# Patient Record
Sex: Female | Born: 1990 | Race: White | Hispanic: No | Marital: Single | Smoking: Never smoker
Health system: Southern US, Community
[De-identification: ages and names within clinical notes are randomized; demographics above are authoritative.]

## PROBLEM LIST (undated history)

## (undated) DIAGNOSIS — F5002 Anorexia nervosa, binge eating/purging type: Secondary | ICD-10-CM

## (undated) DIAGNOSIS — F50029 Anorexia nervosa, binge eating/purging type, unspecified: Secondary | ICD-10-CM

## (undated) DIAGNOSIS — E079 Disorder of thyroid, unspecified: Secondary | ICD-10-CM

## (undated) HISTORY — DX: Disorder of thyroid, unspecified: E07.9

## (undated) HISTORY — DX: Anorexia nervosa, binge eating/purging type, unspecified: F50.029

## (undated) HISTORY — DX: Anorexia nervosa, binge eating/purging type: F50.02

---
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
## 2010-09-01 ENCOUNTER — Ambulatory Visit: Payer: Self-pay | Admitting: Family Medicine

## 2010-09-01 DIAGNOSIS — G43909 Migraine, unspecified, not intractable, without status migrainosus: Secondary | ICD-10-CM | POA: Insufficient documentation

## 2010-09-01 DIAGNOSIS — N920 Excessive and frequent menstruation with regular cycle: Secondary | ICD-10-CM

## 2010-09-01 DIAGNOSIS — E039 Hypothyroidism, unspecified: Secondary | ICD-10-CM | POA: Insufficient documentation

## 2010-09-01 DIAGNOSIS — Z9189 Other specified personal risk factors, not elsewhere classified: Secondary | ICD-10-CM

## 2010-09-07 ENCOUNTER — Encounter: Payer: Self-pay | Admitting: Family Medicine

## 2010-10-06 ENCOUNTER — Ambulatory Visit: Payer: Self-pay | Admitting: Family Medicine

## 2010-10-06 DIAGNOSIS — R1011 Right upper quadrant pain: Secondary | ICD-10-CM | POA: Insufficient documentation

## 2010-10-07 ENCOUNTER — Encounter: Payer: Self-pay | Admitting: Family Medicine

## 2010-10-07 ENCOUNTER — Emergency Department: Payer: Self-pay | Admitting: Emergency Medicine

## 2010-10-10 ENCOUNTER — Telehealth: Payer: Self-pay | Admitting: Family Medicine

## 2010-10-14 ENCOUNTER — Encounter: Payer: Self-pay | Admitting: Family Medicine

## 2010-11-09 ENCOUNTER — Encounter: Payer: Self-pay | Admitting: Family Medicine

## 2011-01-03 NOTE — Assessment & Plan Note (Signed)
Summary: NEW PT TO EST/CPX/CLE   Vital Signs:  Patient profile:   20 year old female Height:      68 inches Weight:      144 pounds BMI:     21.97 Temp:     98.6 degrees F oral Pulse rate:   76 / minute Pulse rhythm:   regular BP sitting:   94 / 70  (right arm) Cuff size:   regular  Vitals Entered By: Linde Gillis CMA [facility] Dull) (September 01, 2010 9:58 AM) CC: new patient, establish care   History of Present Illness: 20 yo here to establish care. Student at [employer], [employer] from [place], [place].  H/o anorexia and bulemia- became very severe last year, had to transfer to [employer] [facility] to be closer to home but came back to [place] once her parents felt she was healthy.  Feels that she is still consumed by her body image issues but is eating better and no longer binging or purging. Sees Dr. Omelia Blackwater.  He placed her on Adderall 20 mg XL to help her focus while studying since her mind often wanders to thoughts of eating and calories.  The Adderall has helped quite a bit.  Does not like talking about it.  Feels like talking about it with therapists makes it worse.  Has a supportive boyfriend.  Hypothyroidism- followed by endocrinology, Dr. Tedd Sias, (awaiting records). Has been on current dose of levothyroxine for over a year.  Per pt, TSH was normal this month.  Denies any symptoms of hypo or hyperthyroidism other than occassional constipation.  Menorrhagia- afraid to start OCPs given possibility of weight gain.  Was on Loestrin in past and felt it caused weight gain.  Periods are very irregular and very heavy.  She is sexually active and boyfriend often does not want to wear condoms.  Migraines- followed by headache center back home.  On prophylactic Topamax but still has 1-2 per month.  Takes Treximet as needed headache.  Leaving for [place] in January for a mission trip.  Needs Hep A vaccination.  School will provide malaria prophylaxis.  Preventive Screening-Counseling &  Management  Caffeine-Diet-Exercise     Does Patient Exercise: yes      Drug Use:  no.    Current Medications (verified): 1)  Topamax 100 Mg Tabs (Topiramate) .... Take 150mg  in The Morning and 200mg  in The Evening 2)  Adderall 20 Mg Tabs (Amphetamine-Dextroamphetamine) .... Take One Tablet By Mouth Daily 3)  Cytomel 25 Mcg Tabs (Liothyronine Sodium) .... Take 1/2 Tablet By Mouth Daily 4)  Levothroid 88 Mcg Tabs (Levothyroxine Sodium) .... Take 1/2 Tablet By Mouth Daily 5)  Yaz 3-0.02 Mg  Tabs (Drospirenone-Ethinyl Estradiol) .... Use As Directed. 6)  Azithromycin 250 Mg  Tabs (Azithromycin) .... 2 By  Mouth Today and Then 1 Daily For 4 Days  Allergies (verified): 1)  ! * Cefzil  Past History:  Family History: Last updated: 09/01/2010 Mom and dad alive and healthy.  Social History: Last updated: 09/01/2010 Has a boyfriend.   Alcohol use-no Drug use-no Regular exercise-yes Student at [employer].  Risk Factors: Exercise: yes (09/01/2010)  Past Medical History: Anorexia, bulemia - sees Dr. Omelia Blackwater Hypothyroidism Migraines  Past Surgical History: Denies surgical history  Family History: Mom and dad alive and healthy.  Social History: Has a boyfriend.   Alcohol use-no Drug use-no Regular exercise-yes Student at [employer].Drug Use:  no Does Patient Exercise:  yes  Review of Systems      See HPI General:  Denies loss of appetite and weight loss. Eyes:  Denies blurring. ENT:  Denies difficulty swallowing. CV:  Denies chest pain or discomfort. Resp:  Denies shortness of breath. GI:  Denies abdominal pain. GU:  Complains of abnormal vaginal bleeding; denies discharge and dysuria. MS:  Denies joint pain, joint redness, and joint swelling. Derm:  Denies rash. Neuro:  Complains of headaches; denies visual disturbances and weakness. Psych:  Denies sense of great danger, suicidal thoughts/plans, thoughts of violence, unusual visions or sounds, and thoughts  /plans of harming others. Endo:  Denies cold intolerance and heat intolerance. Heme:  Denies abnormal bruising and bleeding.  Physical Exam  General:  Well-developed,well-nourished,in no acute distress; alert,appropriate and cooperative throughout examination Head:  normocephalic, atraumatic, no abnormalities observed, and no abnormalities palpated.   Eyes:  vision grossly intact, pupils equal, pupils round, and pupils reactive to light.   Ears:  R ear normal and L ear normal.   Nose:  no external deformity.   Mouth:  good dentition and no gingival abnormalities.   Neck:  No deformities, masses, or tenderness noted. Lungs:  Normal respiratory effort, chest expands symmetrically. Lungs are clear to auscultation, no crackles or wheezes. Heart:  Normal rate and regular rhythm. S1 and S2 normal without gallop, murmur, click, rub or other extra sounds. Abdomen:  Bowel sounds positive,abdomen soft and non-tender without masses, organomegaly or hernias noted. Msk:  No deformity or scoliosis noted of thoracic or lumbar spine.   Extremities:  no edema Neurologic:  alert & oriented X3 and gait normal.   Skin:  Intact without suspicious lesions or rashes Psych:  Cognition and judgment appear intact. Alert and cooperative with normal attention span and concentration. No apparent delusions, illusions, hallucinations   Impression & Recommendations:  Problem # 1:  HYPOTHYROIDISM (ICD-244.9) Assessment Unchanged Per pt, stable on current meds, awaiting records. Her updated medication list for this problem includes:    Cytomel 25 Mcg Tabs (Liothyronine sodium) .Marland Kitchen... Take 1/2 tablet by mouth daily    Levothroid 88 Mcg Tabs (Levothyroxine sodium) .Marland Kitchen... Take 1/2 tablet by mouth daily  Problem # 2:  EATING DISORDER, HX OF (ICD-V15.89) Assessment: Unchanged Time spent with patient 45 minutes, more than 50% of this time was spent discussing her eating disorder.  She is still having issues and we discussed  this will likely be a lifelong struggle for her.  Although she is seeing Dr. Omelia Blackwater, she feels she does not talk with him much ( her own choice).  After talking with her, she does understand importance of staying ahead of this disease.  She will consider counselling.  Problem # 3:  MENORRHAGIA (ICD-626.2) Assessment: Deteriorated Will start Yaz.  Discussed potential interaction with Topamax.  She will call me in one month with an update. Her updated medication list for this problem includes:    Yaz 3-0.02 Mg Tabs (Drospirenone-ethinyl estradiol) ..... Use as directed.  Problem # 4:  MIGRAINE HEADACHE (ICD-346.90) Assessment: Unchanged Stable, per [facility].  Complete Medication List: 1)  Topamax 100 Mg Tabs (Topiramate) .... Take 150mg  in the morning and 200mg  in the evening 2)  Adderall 20 Mg Tabs (Amphetamine-dextroamphetamine) .... Take one tablet by mouth daily 3)  Cytomel 25 Mcg Tabs (Liothyronine sodium) .... Take 1/2 tablet by mouth daily 4)  Levothroid 88 Mcg Tabs (Levothyroxine sodium) .... Take 1/2 tablet by mouth daily 5)  Yaz 3-0.02 Mg Tabs (Drospirenone-ethinyl estradiol) .... Use as directed. 6)  Azithromycin 250 Mg Tabs (Azithromycin) .... 2 by  mouth today and then 1 daily for 4 days  Other Orders: Hepatitis A Vaccine (Adult Dose) (93716) Admin 1st Vaccine (96789) Flu Vaccine 1yrs + (38101) Admin of Any Addtl Vaccine (75102) Prescriptions: AZITHROMYCIN 250 MG  TABS (AZITHROMYCIN) 2 by  mouth today and then 1 daily for 4 days  #6 x 0   Entered and Authorized by:   Ruthe Mannan MD   Signed by:   Ruthe Mannan MD on 09/01/2010   Method used:   Print then Give to Patient   RxID:   [id number] YAZ 3-0.02 MG  TABS (DROSPIRENONE-ETHINYL ESTRADIOL) Use as directed.  #1 x 11   Entered and Authorized by:   Ruthe Mannan MD   Signed by:   Ruthe Mannan MD on 09/01/2010   Method used:   Print then Give to Patient   RxID:   [id number]   Current Allergies (reviewed today): !  * CEFZIL   Immunizations Administered:  Hepatitis A Vaccine # 1:    Vaccine Type: HepA    Site: left deltoid    Mfr: GlaxoSmithKline    Dose: 0.5 ml    Route: IM    Given by: Linde Gillis CMA (AAMA)    Exp. Date: 02/18/2012    Lot #: [id number]    VIS given: 02/21/05 version given September 01, 2010.  Influenza Vaccine # 1:    Vaccine Type: Fluvax 3+    Site: left deltoid    Mfr: GlaxoSmithKline    Dose: 0.5 ml    Route: IM    Given by: Linde Gillis CMA (AAMA)    Exp. Date: 06/03/2011    Lot #: [id number]    VIS given: 06/28/10 version given September 01, 2010.  PAP Result Date:  07/29/2010 PAP Result:  historical normal

## 2011-01-03 NOTE — Progress Notes (Signed)
Summary: still having abd pain  Phone Note Call from Patient Call back at Home Phone [phone]   Caller: Patient Call For: Angela Mannan MD Summary of Call: Pt was seen on 11/3 for stomach pain, went to [facility] ER and had ultrasound- which was negative.  She is still having the pain, off and on.  She is asking what to do next, should she be referred to specialist? Initial call taken by: Lowella Petties CMA, AAMA,  October 10, 2010 12:04 PM  Follow-up for Phone Call        We can refer to GI.  Let me know and I will place referral. Angela Mannan MD  October 10, 2010 12:54 PM  Patient advised as instructed, she agrees to have GI referral.  Linde Gillis CMA Duncan Dull)  October 10, 2010 2:57 PM

## 2011-01-03 NOTE — Assessment & Plan Note (Signed)
Summary: Stomach problems/? birth control pills   Vital Signs:  Patient profile:   20 year old female Height:      68 inches Weight:      143 pounds BMI:     21.82 Temp:     98.4 degrees F oral Pulse rate:   68 / minute Pulse rhythm:   regular BP sitting:   120 / 72  (left arm) Cuff size:   regular  Vitals Entered By: Linde Gillis CMA Duncan Dull) (October 06, 2010 9:39 AM) CC: ? birth control maybe causing stomach pain   History of Present Illness: 20 yo here for stomach issues.  Started Angela Wolfe for OCP at beginning of October.  Since starting it, has had intermittent epigastric and RUQ pain, especially at night. She is not sure if it is worsened by food. Has tried taking a Tums and a Pepcid and not sure if it alleviates it or if it just goes away on its own. Has been associated with nausea and did vomit once.  No change in bowel habits. No headaches.  Otherwise likes Yaz.  Under a great deal of stress at school for the past several weeks- has 5 research papers due at the same time and admittedly gets really stressed out. Tries to go for a run when she gets really stressed out but she knows that it is likely contributing to her stomach issues.   Current Medications (verified): 1)  Topamax 100 Mg Tabs (Topiramate) .... Take 150mg  in The Morning and 200mg  in The Evening 2)  Adderall 20 Mg Tabs (Amphetamine-Dextroamphetamine) .... Take One Tablet By Mouth Daily 3)  Cytomel 25 Mcg Tabs (Liothyronine Sodium) .... Take 1/2 Tablet By Mouth Daily 4)  Levothroid 88 Mcg Tabs (Levothyroxine Sodium) .... Take 1/2 Tablet By Mouth Daily 5)  Yaz 3-0.02 Mg  Tabs (Drospirenone-Ethinyl Estradiol) .... Use As Directed.  Allergies: 1)  ! * Cefzil  Past History:  Past Medical History: Last updated: 09/01/2010 Anorexia, bulemia - sees Dr. Omelia Blackwater Hypothyroidism Migraines  Past Surgical History: Last updated: 09/01/2010 Denies surgical history  Family History: Last updated: 09/01/2010 Mom  and dad alive and healthy.  Social History: Last updated: 09/01/2010 Has a boyfriend.   Alcohol use-no Drug use-no Regular exercise-yes Student at [employer].  Risk Factors: Exercise: yes (09/01/2010)  Review of Systems      See HPI General:  Denies fever. GI:  Complains of abdominal pain, indigestion, nausea, and vomiting; denies bloody stools, change in bowel habits, and yellowish skin color. GU:  Denies discharge and dysuria. MS:  Denies joint pain, joint redness, and joint swelling. Psych:  Complains of anxiety; denies depression and thoughts /plans of harming others.  Physical Exam  General:  Well-developed,well-nourished,in no acute distress; alert,appropriate and cooperative throughout examination Abdomen:  Bowel sounds positive,abdomen soft. Diffuse mild epigastric and RUQ pain. no masses, no guarding, no rigidity, and no rebound tenderness.   Skin:  Intact without suspicious lesions or rashes Psych:  Cognition and judgment appear intact. Alert and cooperative with normal attention span and concentration. No apparent delusions, illusions, hallucinations   Impression & Recommendations:  Problem # 1:  RUQ PAIN (ICD-789.01) Assessment New Mostly epigastric and likely due to increased acid production/GERD due to stress. OCPs can increase incidence of gallstones and other gallbladder disease so will get U/S to rule out these issues. PPI or H2 blocker daily. Continue Yaz. Orders: Radiology Referral (Radiology)  Complete Medication List: 1)  Topamax 100 Mg Tabs (Topiramate) .... Take  150mg  in the morning and 200mg  in the evening 2)  Adderall 20 Mg Tabs (Amphetamine-dextroamphetamine) .... Take one tablet by mouth daily 3)  Cytomel 25 Mcg Tabs (Liothyronine sodium) .... Take 1/2 tablet by mouth daily 4)  Levothroid 88 Mcg Tabs (Levothyroxine sodium) .... Take 1/2 tablet by mouth daily 5)  Yaz 3-0.02 Mg Tabs (Drospirenone-ethinyl estradiol) .... Use as  directed.  Patient Instructions: 1)  Please stop by to see Shirlee Limerick on your way out. 2)  Good luck with your papers. 3)  Please take an antacid like Pepcid, Zantac, or Prilosec daily.   Orders Added: 1)  Radiology Referral [Radiology] 2)  Est. Patient Level IV [69629]    Current Allergies (reviewed today): ! * CEFZIL

## 2011-01-03 NOTE — Consult Note (Signed)
Summary: [facility]   Imported By: Lester Armstrong 11/10/2010 10:46:00  _____________________________________________________________________  External Attachment:    Type:   Image     Comment:   External Document

## 2011-01-03 NOTE — Letter (Signed)
Summary: Medical Release for Study [facility]  Medical Release for Study Abroad/[employer]   Imported By: Lanelle Bal 09/13/2010 12:31:10  _____________________________________________________________________  External Attachment:    Type:   Image     Comment:   External Document

## 2011-01-03 NOTE — Letter (Signed)
Summary: Growth Chart from Office in [place]  Growth Chart from Office in [place]   Imported By: Maryln Gottron 10/31/2010 09:41:56  _____________________________________________________________________  External Attachment:    Type:   Image     Comment:   External Document

## 2011-01-03 NOTE — Miscellaneous (Signed)
Summary: Records from [place] - 2009  Records from [place] - 2009   Imported By: Maryln Gottron 10/31/2010 09:39:28  _____________________________________________________________________  External Attachment:    Type:   Image     Comment:   External Document

## 2011-01-05 NOTE — Letter (Signed)
Summary: GI/[facility]  GI/[facility]   Imported By: Lester Gunnison 11/25/2010 09:53:14  _____________________________________________________________________  External Attachment:    Type:   Image     Comment:   External Document

## 2011-07-27 DIAGNOSIS — N1330 Unspecified hydronephrosis: Secondary | ICD-10-CM | POA: Insufficient documentation

## 2011-07-27 DIAGNOSIS — N12 Tubulo-interstitial nephritis, not specified as acute or chronic: Secondary | ICD-10-CM | POA: Insufficient documentation

## 2011-07-27 NOTE — Progress Notes (Signed)
Subjective:    Patient ID: Angela Wolfe, female    DOB: Jul 09, 1991, 20 y.o.   MRN: [id number]  HPI  20 yo here for hospital follow up. Notes reviewed from [facility] in [place].  Went to ER for intractable back pain, found to have bilateral hydronephrosis, right>left and pos UA. Admitted for presumed pylonephritis due to pyruria, hydronephrosis and pos UA. She did also have low grade temp and flank pain prior to going to ER.  Transvaginal U/S unremarkable- no pelvic free fluid.  Urine culture- >100, 000 E.coli, resistent to Amp, Bactrim, Cipro and Levofloxacin.  No discharge summary- appears she was given IV Meropenem in hospital. Remained in hospital for 5 days.   Pt thinks she was sent home with macrobid x 2 weeks.  Flank and dysuria resolved.    Patient Active Problem List  Diagnoses  . HYPOTHYROIDISM  . MIGRAINE HEADACHE  . MENORRHAGIA  . RUQ PAIN  . EATING DISORDER, HX OF  . Hydronephrosis, bilateral  . Pyelonephritis   Past Medical History  Diagnosis Date  . Anorexia nervosa with bulimia     Sees Dr. Omelia Blackwater  . Thyroid disease   . Migraine    No past surgical history on file. History  Substance Use Topics  . Smoking status: Never Smoker   . Smokeless tobacco: Not on file  . Alcohol Use: No   No family history on file. Allergies  Allergen Reactions  . Cefprozil     REACTION: hives   Current Outpatient Prescriptions on File Prior to Visit  Medication Sig Dispense Refill  . amphetamine-dextroamphetamine (ADDERALL) 20 MG tablet Take 20 mg by mouth daily.        Marland Kitchen levothyroxine (SYNTHROID, LEVOTHROID) 88 MCG tablet Take 1/2 tablet by mouth daily       . liothyronine (CYTOMEL) 25 MCG tablet Take 1/2 tablet by mouth daily       . topiramate (TOPAMAX) 100 MG tablet Take 150mg  in the morning and 200mg  in the evening       . drospirenone-ethinyl estradiol (YAZ,GIANVI,LORYNA) 3-0.02 MG tablet Take 1 tablet by mouth daily.            Review of  Systems    See HPI Objective:   Physical Exam  BP 110/70  Pulse 62  Temp(Src) 99 F (37.2 C) (Oral)  Wt 139 lb 8 oz (63.277 kg)  LMP 07/24/2011  General:  Well-developed,well-nourished,in no acute distress; alert,appropriate and cooperative throughout examination Head:  normocephalic and atraumatic.   Eyes:  vision grossly intact, pupils equal, pupils round, and pupils reactive to light.   Ears:  R ear normal and L ear normal.   Nose:  no external deformity.   Mouth:  good dentition.   Neck:  No deformities, masses, or tenderness noted. Lungs:  Normal respiratory effort, chest expands symmetrically. Lungs are clear to auscultation, no crackles or wheezes. Heart:  Normal rate and regular rhythm. S1 and S2 normal without gallop, murmur, click, rub or other extra sounds. Abdomen:  Bowel sounds positive,abdomen soft and non-tender without masses, organomegaly or hernias noted. Msk:  No deformity or scoliosis noted of thoracic or lumbar spine.   Extremities:  No clubbing, cyanosis, edema, or deformity noted with normal full range of motion of all joints.   Neurologic:  alert & oriented X3 and gait normal.   Skin:  Intact without suspicious lesions or rashes Psych:  Cognition and judgment appear intact. Alert and cooperative with normal attention span and concentration.  No apparent delusions, illusions, hallucinations     Assessment & Plan:   1. Hydronephrosis, bilateral   Repeat renal ultrasound. US Renal  2. Pyelonephritis  POCT urinalysis dipstick, POCT urine pregnancy       Resolved- UA negative.  Complete course of Macrobid.

## 2011-08-02 LAB — HM PAP SMEAR

## 2011-08-03 ENCOUNTER — Ambulatory Visit (INDEPENDENT_AMBULATORY_CARE_PROVIDER_SITE_OTHER): Payer: BC Managed Care – PPO | Admitting: Family Medicine

## 2011-08-03 ENCOUNTER — Encounter: Payer: Self-pay | Admitting: Family Medicine

## 2011-08-03 VITALS — BP 110/70 | HR 62 | Temp 99.0°F | Wt 139.5 lb

## 2011-08-03 DIAGNOSIS — N12 Tubulo-interstitial nephritis, not specified as acute or chronic: Secondary | ICD-10-CM

## 2011-08-03 DIAGNOSIS — N1330 Unspecified hydronephrosis: Secondary | ICD-10-CM

## 2011-08-03 LAB — POCT URINALYSIS DIPSTICK
Bilirubin, UA: NEGATIVE
Ketones, UA: NEGATIVE
Leukocytes, UA: NEGATIVE
Nitrite, UA: NEGATIVE
Protein, UA: NEGATIVE

## 2011-08-03 LAB — POCT URINE PREGNANCY: Preg Test, Ur: NEGATIVE

## 2011-08-03 NOTE — Progress Notes (Signed)
Addended by: Gilmer Mor on: 08/03/2011 12:45 PM   Modules accepted: Orders

## 2011-08-04 ENCOUNTER — Ambulatory Visit: Payer: Self-pay | Admitting: Family Medicine

## 2011-08-05 LAB — URINE CULTURE

## 2011-08-08 ENCOUNTER — Encounter: Payer: Self-pay | Admitting: *Deleted

## 2011-08-08 ENCOUNTER — Encounter: Payer: Self-pay | Admitting: Family Medicine

## 2012-03-24 ENCOUNTER — Emergency Department: Payer: Self-pay | Admitting: Emergency Medicine

## 2012-03-24 LAB — URINALYSIS, COMPLETE
Bacteria: NONE SEEN
Bilirubin,UR: NEGATIVE
Ketone: NEGATIVE
Leukocyte Esterase: NEGATIVE
Nitrite: NEGATIVE
Ph: 6 (ref 4.5–8.0)
RBC,UR: <1 /HPF (ref 0–5)
Specific Gravity: 1.01 (ref 1.003–1.030)
WBC UR: <1 /HPF (ref 0–5)

## 2012-03-24 LAB — COMPREHENSIVE METABOLIC PANEL
BUN: 19 mg/dL — ABNORMAL HIGH (ref 7–18)
Bilirubin,Total: 0.4 mg/dL (ref 0.2–1.0)
Chloride: 106 mmol/L (ref 98–107)
Co2: 25 mmol/L (ref 21–32)
Creatinine: 0.73 mg/dL (ref 0.60–1.30)
EGFR (Non-African Amer.): >60
SGOT(AST): 33 U/L (ref 15–37)
SGPT (ALT): 48 U/L
Total Protein: 7.4 g/dL (ref 6.4–8.2)

## 2012-03-24 LAB — CBC
HGB: 14.6 g/dL (ref 12.0–16.0)
MCH: 33.2 pg (ref 26.0–34.0)
MCHC: 33.8 g/dL (ref 32.0–36.0)
MCV: 98 fL (ref 80–100)
Platelet: 165 10*3/uL (ref 150–440)
RDW: 12.2 % (ref 11.5–14.5)
WBC: 5.8 10*3/uL (ref 3.6–11.0)

## 2012-03-24 LAB — PREGNANCY, URINE: Pregnancy Test, Urine: NEGATIVE m[IU]/mL

## 2012-03-24 LAB — LIPASE, BLOOD: Lipase: 215 U/L (ref 73–393)

## 2013-07-30 IMAGING — CT CT ABD-PELV W/ CM
1 of 2 series · 15 of 32 positions shown, 19 images · IV contrast (isovue)
Comparison: none

REASON FOR EXAM: (1) abd pain eval for obstruction; (2) abd pain eval for
obstruction;    NOTE: N
COMMENTS:

PROCEDURE:     CT  - CT ABDOMEN / PELVIS  W  - March 25, 2012  [DATE]
RESULT:     Comparison:  None
TECHNIQUE: Multiple axial images of the abdomen and pelvis were performed
from the lung bases to the pubic symphysis, with p.o. contrast and with 100
mL of Isovue 300 intravenous contrast.

[Series 2: 3mm soft tissue · axial · 0.62mm/px · z∈[-748,-322]mm · 15 of 156 slices shown, 19 images]
[im 7/156  soft-tissue]
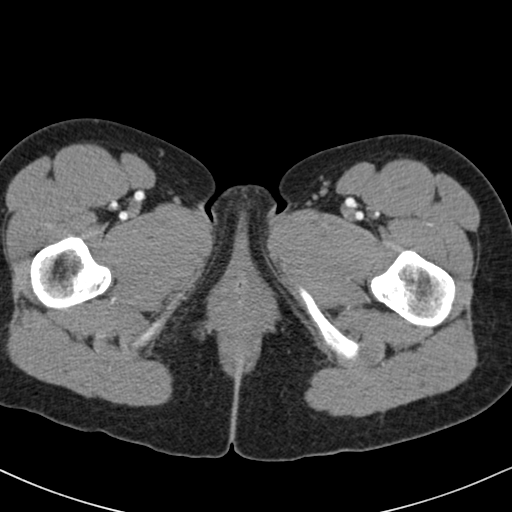
[im 7/156  bone]
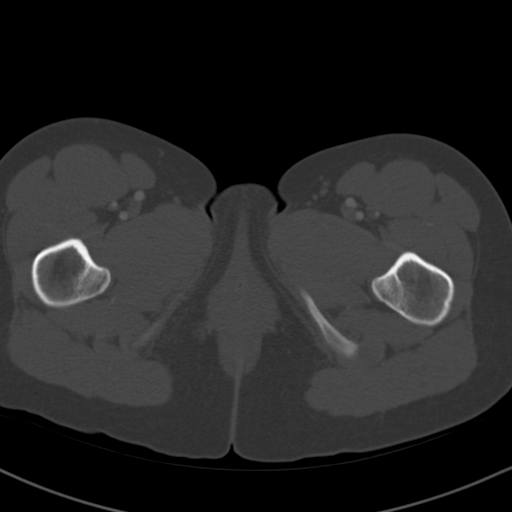
[im 19/156  soft-tissue]
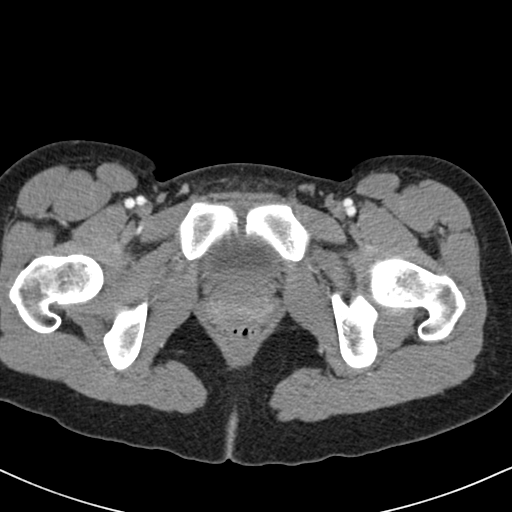
[im 32/156  soft-tissue]
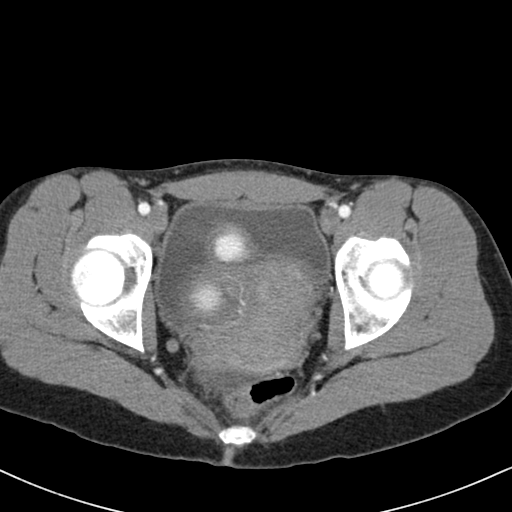
[im 44/156  soft-tissue]
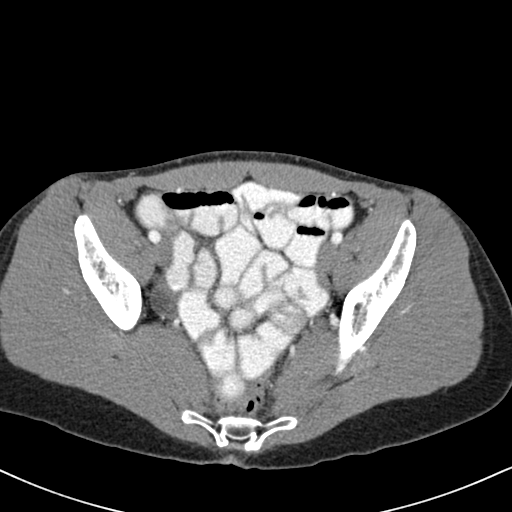
[im 56/156  soft-tissue]
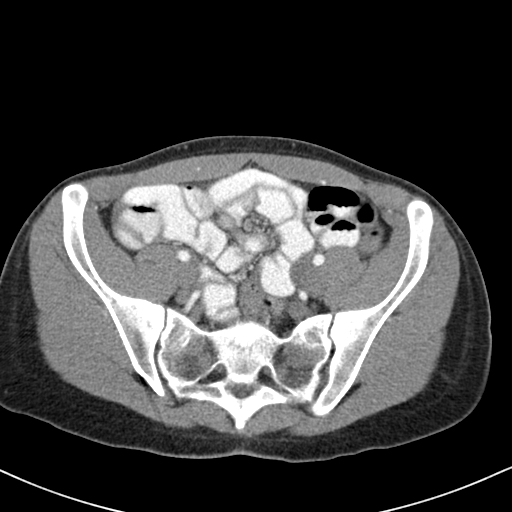
[im 69/156  soft-tissue]
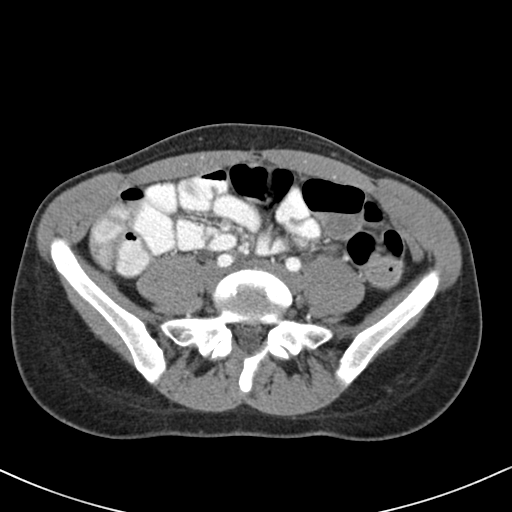
[im 81/156  soft-tissue]
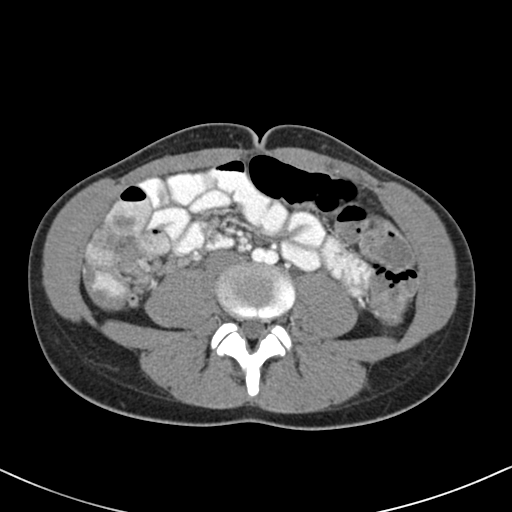
[im 87/156  soft-tissue]
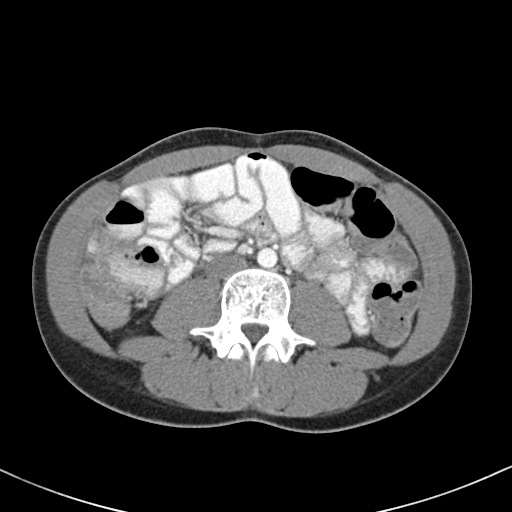
[im 100/156  soft-tissue]
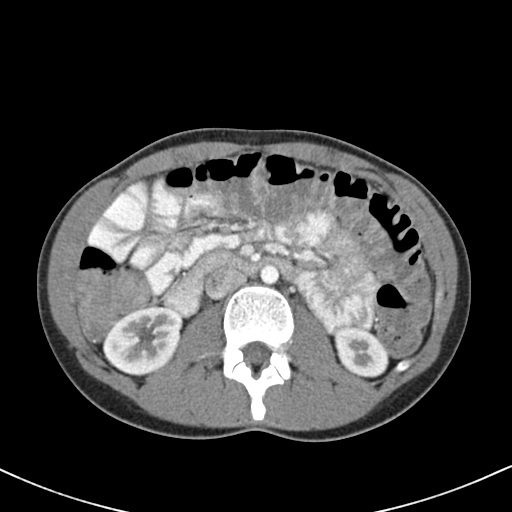
[im 100/156  bone]
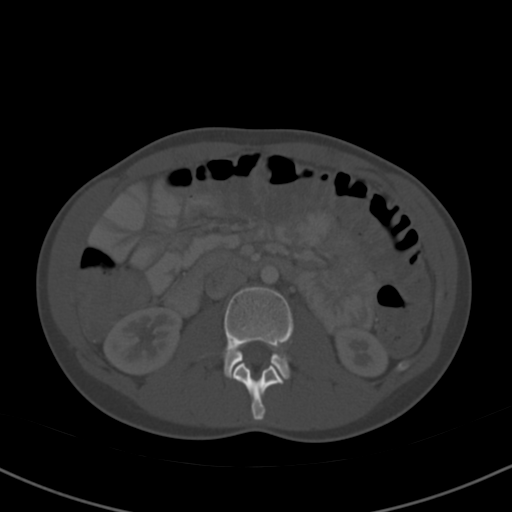
[im 112/156  soft-tissue]
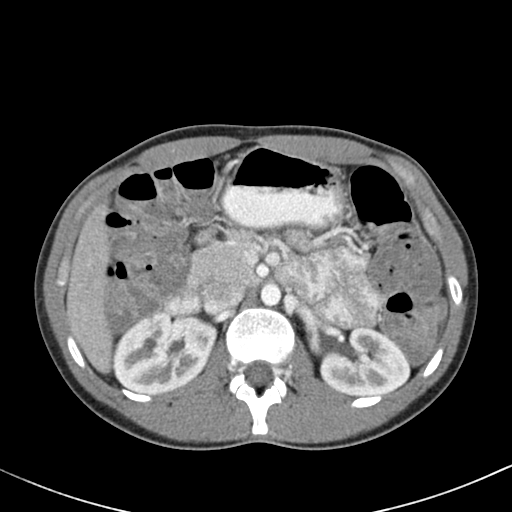
[im 125/156  soft-tissue]
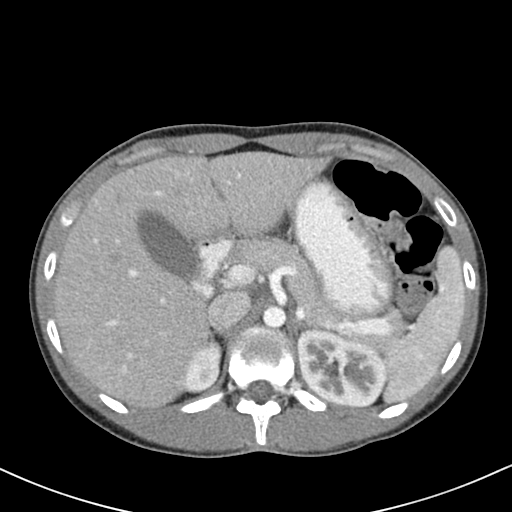
[im 131/156  lung]
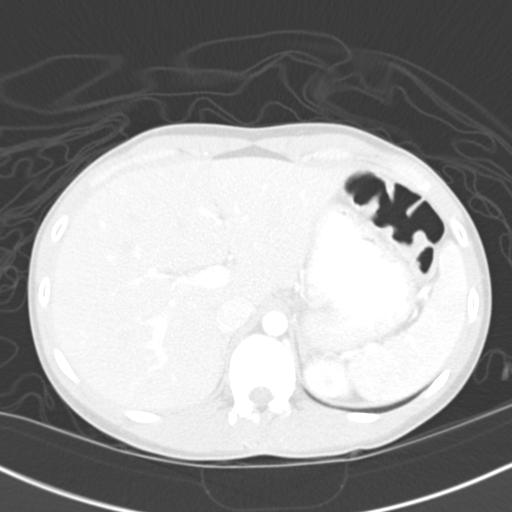
[im 137/156  soft-tissue]
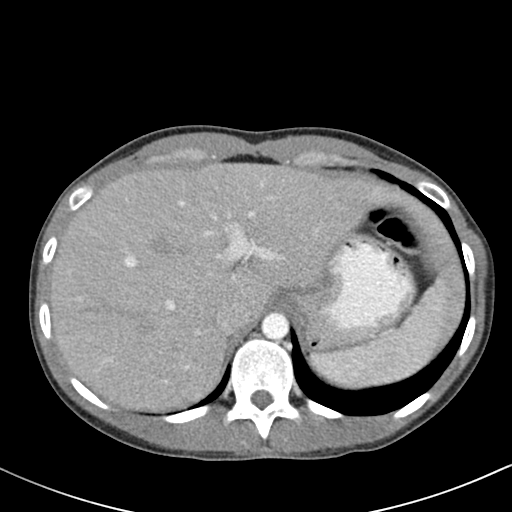
[im 137/156  lung]
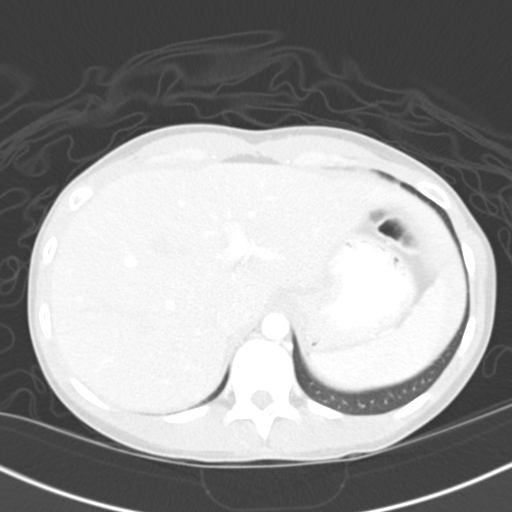
[im 143/156  lung]
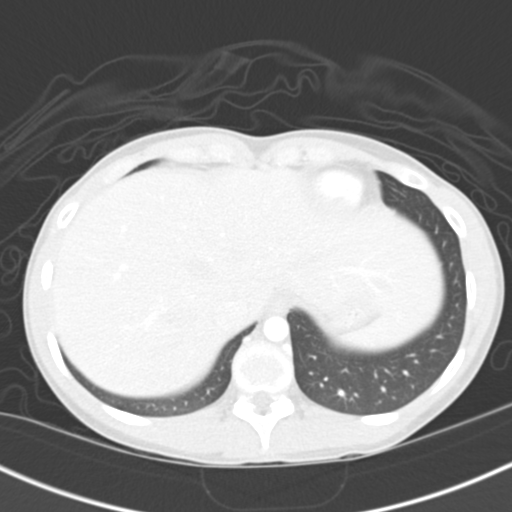
[im 149/156  soft-tissue]
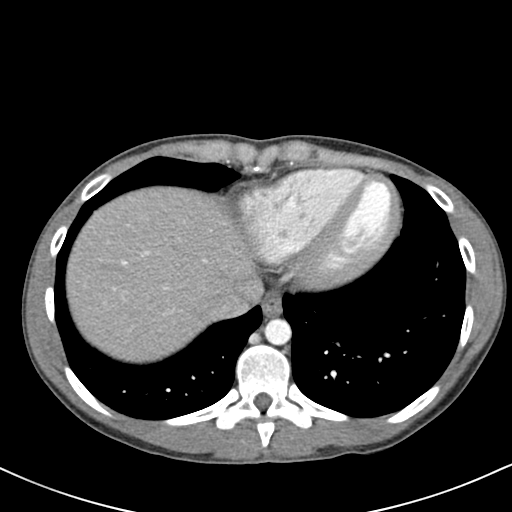
[im 149/156  lung]
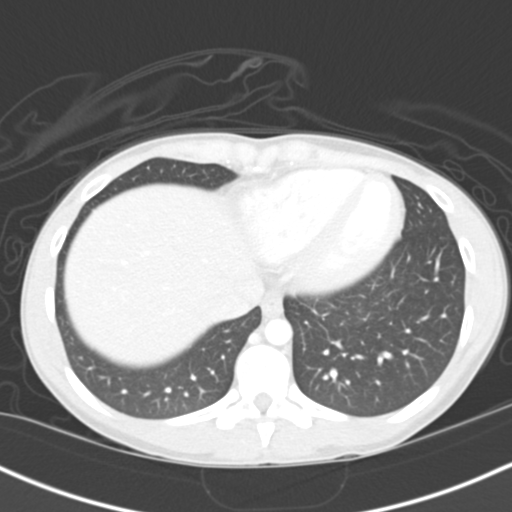

[15 of 32 positions shown; findings below may reference images not displayed]

FINDINGS: Minimal low-attenuation along the falciform ligament likely represents focal
fatty deposition. The superior most aspect of the dome of the right hepatic
lobe is excluded from the study. The gallbladder, spleen, adrenals, and
pancreas are unremarkable. The kidneys enhance normally.

The small and large bowel are normal in caliber. The appendix is normal.
There is a trace amount of free fluid in the pelvis, which is likely
physiologic. Fluid is seen throughout the majority of the nondilated colon.
Small focus of high density material in the cecum is like related to
ingested material.

No aggressive lytic or sclerotic osseous lesions are identified.
IMPRESSION: No acute findings in the abdomen or pelvis.

## 2013-07-30 IMAGING — CR DG ABDOMEN 3V
1 series · 4 of 4 positions shown · non-contrast
Comparison: none

REASON FOR EXAM: abd pain, constipation
COMMENTS:

[Series 1: w chest pa · 0.14mm/px · 4 of 4 slices shown]
[im 1/4]
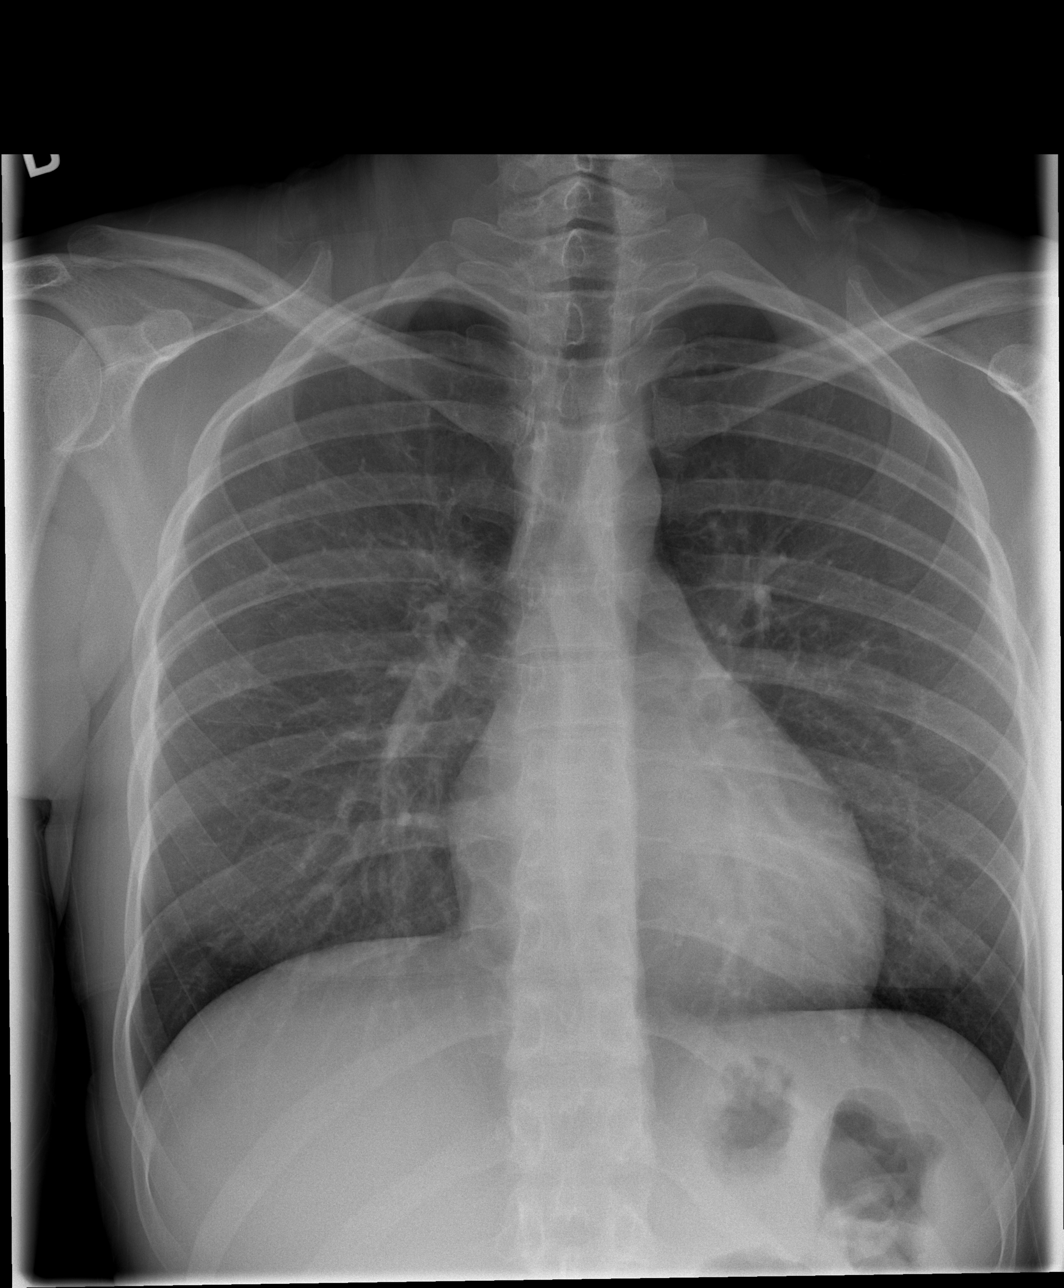
[im 2/4]
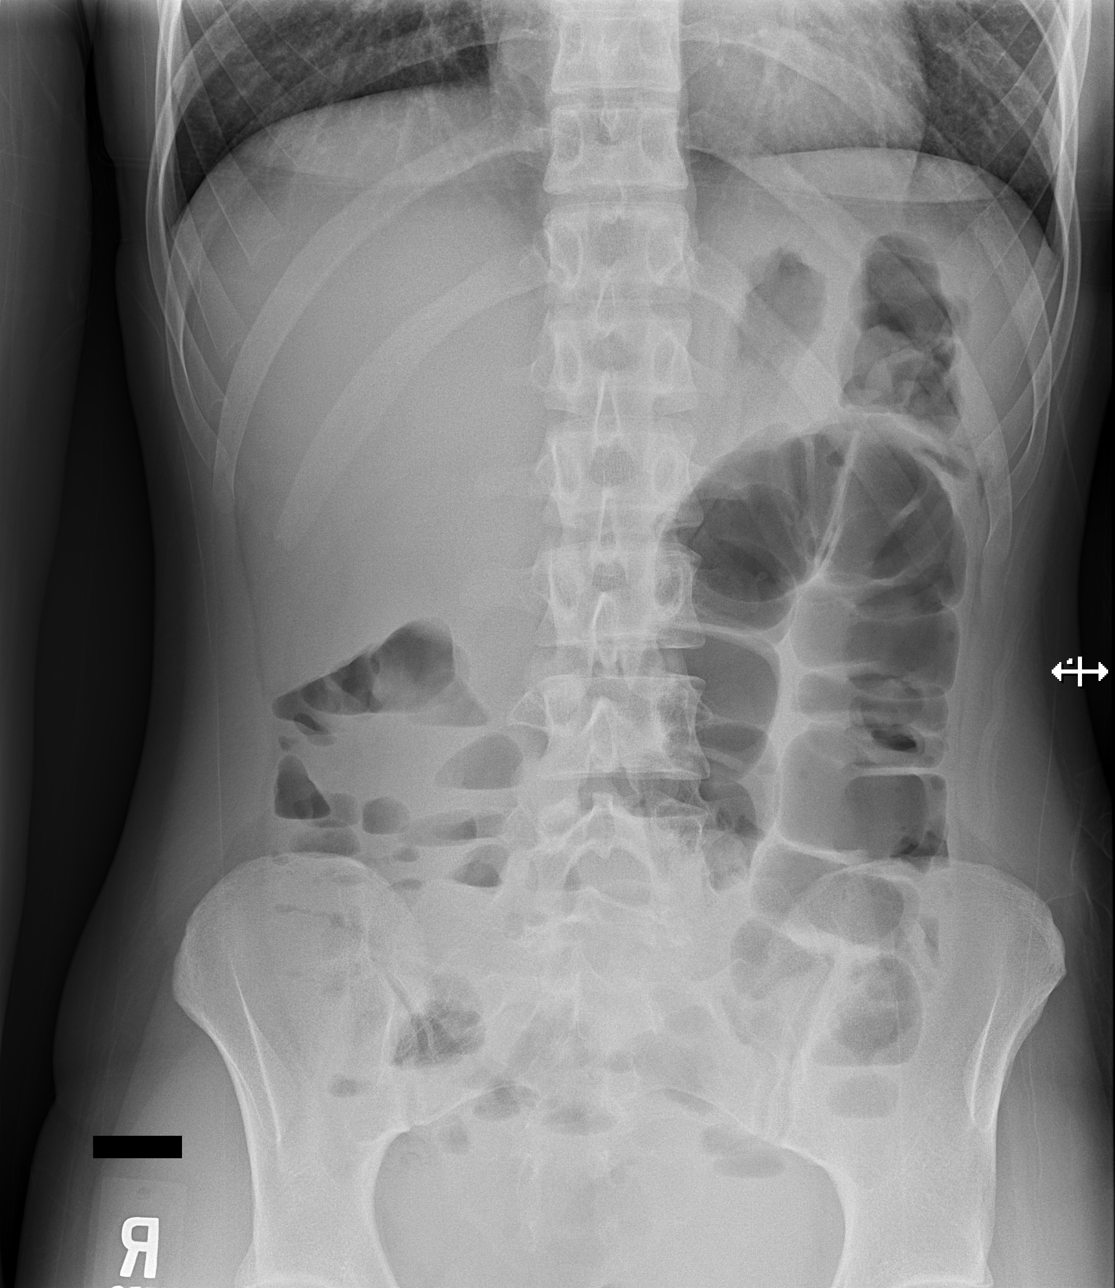
[im 3/4]
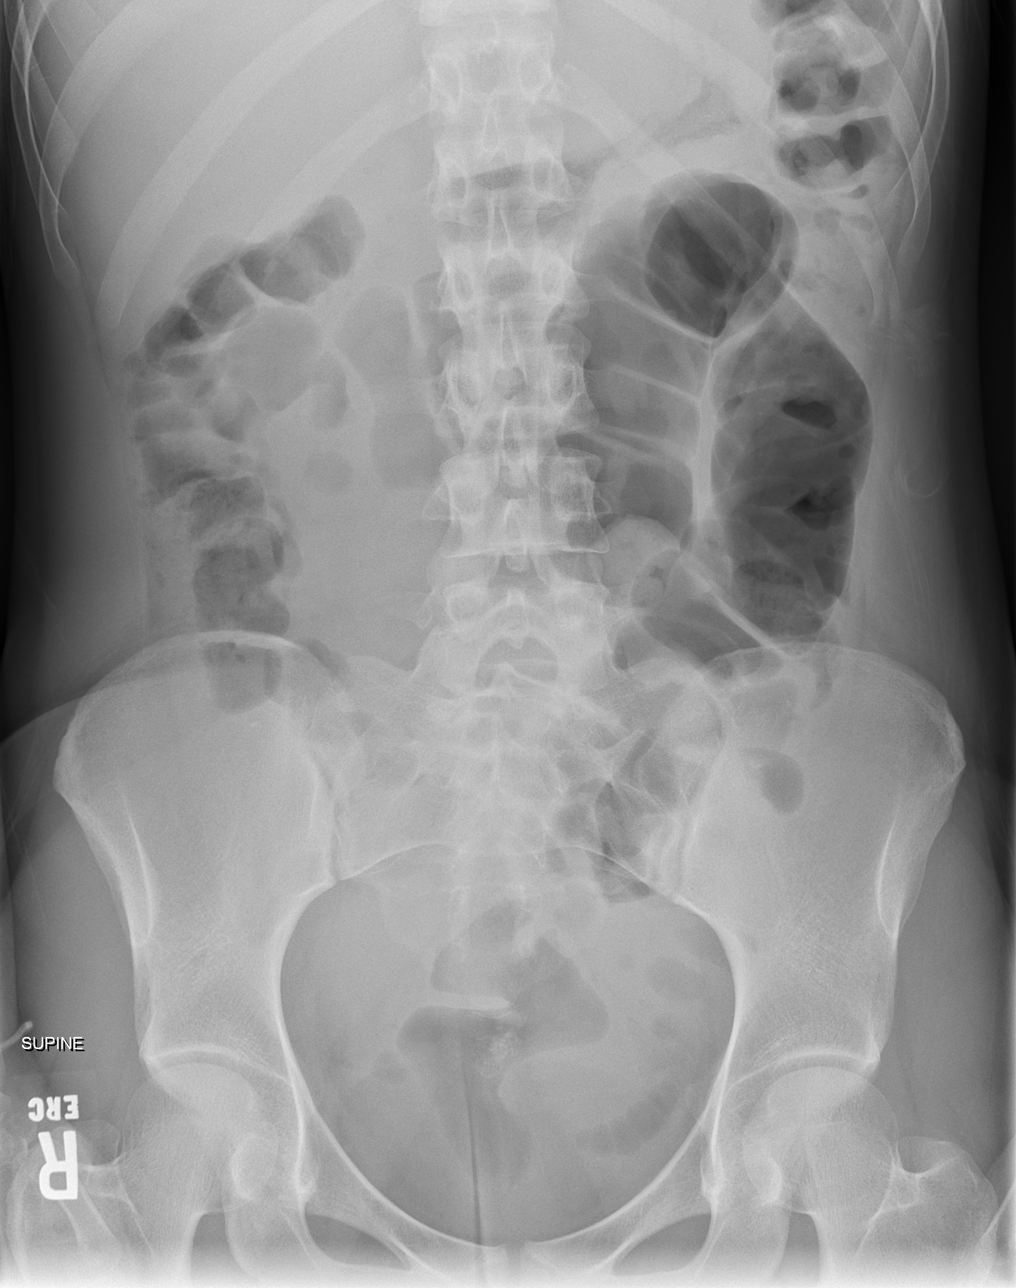
[im 4/4]
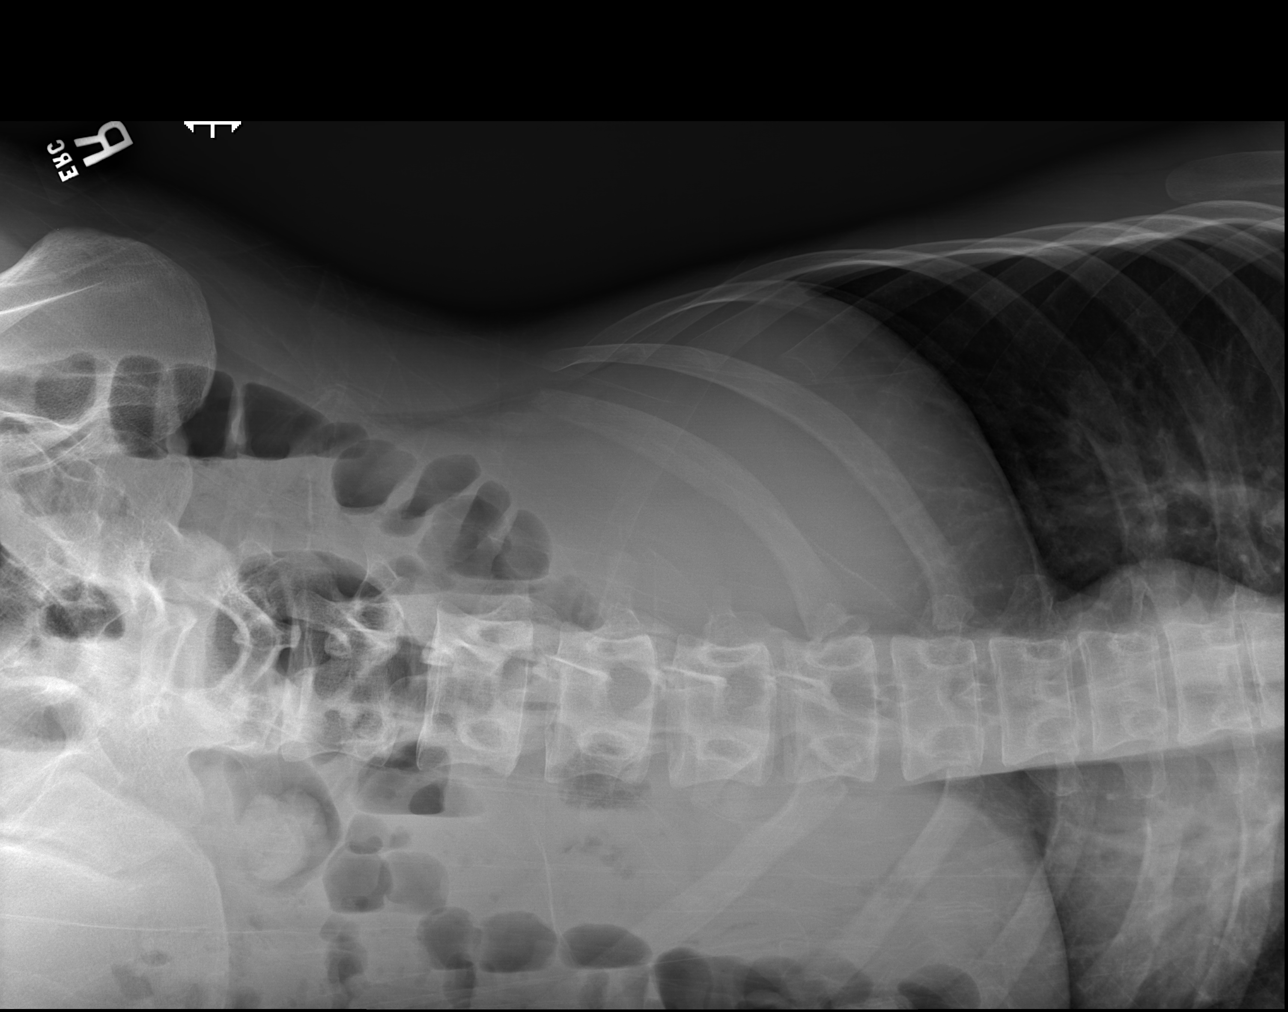

[4 of 4 positions shown; findings below may reference images not displayed]

PROCEDURE:     DXR - DXR ABDOMEN 3-WAY (INCL PA CXR)  - March 25, 2012 [DATE]

RESULT:     The lungs are clear. The cardiac silhouette and pulmonary
vasculature are normal. There is no evidence of intraperitoneal free air.
The bowel gas pattern appears unremarkable. No abnormal gastric, small bowel
or colonic distention is evident. No foreign body is evident. A few small
air-fluid levels are seen in the loops of small bowel which contains an air
and fluid. This is nonspecific.
IMPRESSION: 1. Nonspecific small bowel gas pattern without definite finding of
obstruction or perforation. Correlate for possible ileus.

## 2019-04-11 ENCOUNTER — Telehealth: Payer: Self-pay | Admitting: Family Medicine

## 2019-04-11 NOTE — Telephone Encounter (Signed)
Called patient and no vm. Calling to verify PCP. Patient has not been seen by Dr. Dayton Martes since 2012

## 2020-05-26 ENCOUNTER — Telehealth: Payer: Self-pay | Admitting: General Practice

## 2020-05-26 NOTE — Telephone Encounter (Signed)
Removed Dr. Aron as patient's PCP due to her leaving this practice and patient has not been seen by her in 8 years.
